# Patient Record
Sex: Male | Born: 1975 | Hispanic: Yes | Marital: Single | Smoking: Never smoker
Health system: Southern US, Community
[De-identification: ages and names within clinical notes are randomized; demographics above are authoritative.]

---

## 2022-01-05 ENCOUNTER — Emergency Department (HOSPITAL_COMMUNITY)
Admission: EM | Admit: 2022-01-05 | Discharge: 2022-01-05 | Disposition: A | Discharge status: Home / Self Care | Payer: Self-pay | Attending: Emergency Medicine | Admitting: Emergency Medicine

## 2022-01-05 ENCOUNTER — Other Ambulatory Visit: Payer: Self-pay

## 2022-01-05 ENCOUNTER — Emergency Department (HOSPITAL_COMMUNITY): Payer: Self-pay

## 2022-01-05 ENCOUNTER — Encounter (HOSPITAL_COMMUNITY): Payer: Self-pay | Admitting: *Deleted

## 2022-01-05 DIAGNOSIS — R112 Nausea with vomiting, unspecified: Secondary | ICD-10-CM | POA: Insufficient documentation

## 2022-01-05 DIAGNOSIS — E871 Hypo-osmolality and hyponatremia: Secondary | ICD-10-CM | POA: Insufficient documentation

## 2022-01-05 DIAGNOSIS — R109 Unspecified abdominal pain: Secondary | ICD-10-CM | POA: Insufficient documentation

## 2022-01-05 DIAGNOSIS — R140 Abdominal distension (gaseous): Secondary | ICD-10-CM | POA: Insufficient documentation

## 2022-01-05 LAB — COMPREHENSIVE METABOLIC PANEL
ALT: 25 U/L (ref 0–44)
AST: 21 U/L (ref 15–41)
Albumin: 4.5 g/dL (ref 3.5–5.0)
Alkaline Phosphatase: 83 U/L (ref 38–126)
Anion gap: 5 (ref 5–15)
BUN: 11 mg/dL (ref 6–20)
CO2: 23 mmol/L (ref 22–32)
Calcium: 9 mg/dL (ref 8.9–10.3)
Chloride: 105 mmol/L (ref 98–111)
Creatinine, Ser: 0.84 mg/dL (ref 0.61–1.24)
GFR, Estimated: >60 mL/min (ref >60)
Glucose, Bld: 96 mg/dL (ref 70–99)
Potassium: 4.1 mmol/L (ref 3.5–5.1)
Sodium: 133 mmol/L — ABNORMAL LOW (ref 135–145)
Total Bilirubin: 0.7 mg/dL (ref 0.3–1.2)
Total Protein: 7.5 g/dL (ref 6.5–8.1)

## 2022-01-05 LAB — CBC WITH DIFFERENTIAL/PLATELET
Abs Immature Granulocytes: 0.03 10*3/uL (ref 0.00–0.07)
Basophils Absolute: 0.1 10*3/uL (ref 0.0–0.1)
Basophils Relative: 1 %
Eosinophils Absolute: 0.2 10*3/uL (ref 0.0–0.5)
Eosinophils Relative: 3 %
HCT: 50.7 % (ref 39.0–52.0)
Hemoglobin: 17.5 g/dL — ABNORMAL HIGH (ref 13.0–17.0)
Immature Granulocytes: 0 %
Lymphocytes Relative: 39 %
Lymphs Abs: 2.7 10*3/uL (ref 0.7–4.0)
MCH: 32.1 pg (ref 26.0–34.0)
MCHC: 34.5 g/dL (ref 30.0–36.0)
MCV: 93 fL (ref 80.0–100.0)
Monocytes Absolute: 0.6 10*3/uL (ref 0.1–1.0)
Monocytes Relative: 8 %
Neutro Abs: 3.4 10*3/uL (ref 1.7–7.7)
Neutrophils Relative %: 49 %
Platelets: 330 10*3/uL (ref 150–400)
RBC: 5.45 MIL/uL (ref 4.22–5.81)
RDW: 12.5 % (ref 11.5–15.5)
WBC: 7.1 10*3/uL (ref 4.0–10.5)
nRBC: 0 % (ref 0.0–0.2)

## 2022-01-05 LAB — LIPASE, BLOOD: Lipase: 30 U/L (ref 11–51)

## 2022-01-05 MED ORDER — ONDANSETRON 4 MG PO TBDP: 4.0000 mg | ORAL_TABLET | Freq: Three times a day (TID) | ORAL | 0 refills | Status: AC | PRN

## 2022-01-05 MED ORDER — PANTOPRAZOLE SODIUM 20 MG PO TBEC: 20.0000 mg | DELAYED_RELEASE_TABLET | Freq: Every day | ORAL | 0 refills | Status: AC

## 2022-01-05 MED ORDER — IOHEXOL 300 MG/ML  SOLN
100.0000 mL | Freq: Once | INTRAMUSCULAR | Status: AC | PRN
  Administered 2022-01-05: 19:00:00 100 mL via INTRAVENOUS

## 2022-01-05 MED ORDER — DICYCLOMINE HCL 20 MG PO TABS: 20.0000 mg | ORAL_TABLET | Freq: Two times a day (BID) | ORAL | 0 refills | Status: AC

## 2022-01-05 NOTE — ED Triage Notes (Signed)
Interpretor used. Patient reports heavy alcohol use on 12/31 and having bloating and nausea since.

## 2022-01-05 NOTE — ED Provider Triage Note (Signed)
Emergency Medicine Provider Triage Evaluation Note  Ralph Olson , a 46 y.o. male  was evaluated in triage.  Pt complains of epigastric abdominal pain after drinking excessive amount of alcohol on New Year's.  Patient states that the abdominal pain is located epigastrically and he is complaining of bloating, nausea, diarrhea.  Patient is unable to tell me how much alcohol he drank on New Year's, just states that afterwards he was nauseous and vomiting and felt bloated.  Patient states that his doctors in [place] advised him in the past to cut down on his drinking, however he states that he probably overdid it this time.  Review of Systems  Positive: Bloating, nausea, diarrhea Negative: Blood in stool, blood in vomit, CP, SOB  Physical Exam  BP 140/88 (BP Location: Right Arm)    Pulse 85    Temp 98.8 F (37.1 C) (Oral)    Resp 18    SpO2 100%  Gen:   Awake, no distress   Resp:  Normal effort  MSK:   Moves extremities without difficulty  Other:  Epigastric abdominal pain, LUQ abdominal pain  Medical Decision Making  Medically screening exam initiated at 2:00 PM.  Appropriate orders placed.  Ralph Olson was informed that the remainder of the evaluation will be completed by another provider, this initial triage assessment does not replace that evaluation, and the importance of remaining in the ED until their evaluation is complete.     Al Decant, PA-C 01/05/22 1401

## 2022-01-05 NOTE — Discharge Instructions (Addendum)
Your work-up today including blood work and CT scan of your abdomen did not show any concerning cause of your abdominal pain.  He also mentioned that your abdominal pain currently has resolved.  I have provided you with Protonix for your previously mentioned gastritis.  Bentyl to take as needed for abdominal pain.  And Zofran to take for nausea.  If you have worsening symptoms please return to the emergency room.  I have also provided you with resources to set up a primary care provider.

## 2022-01-05 NOTE — ED Provider Notes (Signed)
[facility] EMERGENCY DEPARTMENT Provider Note   CSN: [id number] Arrival date & time: 01/05/22  1115     History  Chief Complaint  Patient presents with   Abdominal Pain   Nausea    Ralph Olson is a 46 y.o. male.  46 year old male presents today for evaluation of abdominal pain, abdominal distention of 3-1/2-week duration.  Patient reports he has history of alcohol abuse and was told to cut back on his drinking however on New Year's Eve, as well on 1/2, 1/3 he reports binge drinking and since then he has complained of the symptoms.  He reports since then he is only drank once on 1/6 because he was feeling withdrawal symptoms.  Reports he underwent significant testing in [place] with right upper quadrant ultrasound and blood work which was all reassuring prior to moving to [place] 4 months ago.  He does not have a primary care provider in [place].  He denies fever, chills, nausea, vomiting, changes to his stool habits, constipation.  He reports currently he is symptom-free.  He denies difficulty with p.o. intake.  Denies blood in stool.  He denies flank pain, hematuria.  The history is provided by the patient. No language interpreter was used.      Home Medications Prior to Admission medications   Not on File      Allergies    Patient has no known allergies.    Review of Systems   Review of Systems  Constitutional:  Negative for activity change, chills and fever.  Respiratory:  Negative for shortness of breath.   Cardiovascular:  Negative for chest pain.  Gastrointestinal:  Negative for abdominal distention, abdominal pain, nausea and vomiting.  Genitourinary:  Negative for difficulty urinating and flank pain.  Musculoskeletal:  Negative for back pain.  All other systems reviewed and are negative.  Physical Exam Updated Vital Signs BP 112/77 (BP Location: Right Arm)    Pulse 66    Temp 97.6 F (36.4 C) (Oral)    Resp (!) 22     SpO2 97%  Physical Exam Vitals and nursing note reviewed.  Constitutional:      General: He is not in acute distress.    Appearance: Normal appearance. He is not ill-appearing.  HENT:     Head: Normocephalic and atraumatic.     Nose: Nose normal.  Eyes:     General: No scleral icterus.    Extraocular Movements: Extraocular movements intact.     Conjunctiva/sclera: Conjunctivae normal.  Cardiovascular:     Rate and Rhythm: Normal rate and regular rhythm.     Pulses: Normal pulses.     Heart sounds: Normal heart sounds.  Pulmonary:     Effort: Pulmonary effort is normal. No respiratory distress.     Breath sounds: Normal breath sounds. No wheezing or rales.  Abdominal:     General: There is no distension.     Palpations: Abdomen is soft.     Tenderness: There is no abdominal tenderness. There is no right CVA tenderness, left CVA tenderness, guarding or rebound.  Musculoskeletal:        General: Normal range of motion.     Cervical back: Normal range of motion.  Skin:    General: Skin is warm and dry.  Neurological:     General: No focal deficit present.     Mental Status: He is alert. Mental status is at baseline.    ED Results / Procedures / Treatments  Labs (all labs ordered are listed, but only abnormal results are displayed) Labs Reviewed  CBC WITH DIFFERENTIAL/PLATELET - Abnormal; Notable for the following components:      Result Value   Hemoglobin 17.5 (*)    All other components within normal limits  COMPREHENSIVE METABOLIC PANEL - Abnormal; Notable for the following components:   Sodium 133 (*)    All other components within normal limits  LIPASE, BLOOD  URINALYSIS, ROUTINE W REFLEX MICROSCOPIC    EKG None  Radiology CT Abdomen Pelvis W Contrast  Result Date: 01/05/2022 CLINICAL DATA:  Abdominal pain EXAM: CT ABDOMEN AND PELVIS WITH CONTRAST TECHNIQUE: Multidetector CT imaging of the abdomen and pelvis was performed using the standard protocol following  bolus administration of intravenous contrast. RADIATION DOSE REDUCTION: This exam was performed according to the departmental dose-optimization program which includes automated exposure control, adjustment of the mA and/or kV according to patient size and/or use of iterative reconstruction technique. CONTRAST:  150m OMNIPAQUE IOHEXOL 300 MG/ML  SOLN COMPARISON:  None. FINDINGS: Lower chest: Unremarkable. Hepatobiliary: Liver measures 18.4 cm in length. There is fatty infiltration. There is possible tiny 3 mm low-density in the left lobe which could not be characterized. There is no dilation of bile ducts. Gallbladder is unremarkable. Pancreas: No focal abnormality is seen. Spleen: Spleen is unremarkable. Adrenals/Urinary Tract: Adrenals are not enlarged. There is no hydronephrosis. There is high attenuation in the lumen of pelvocaliceal systems in both kidneys and proximal ureters. This may be related to timing of contrast administration with excreted contrast in the collecting system. This limits evaluation for small nonobstructing stones. Urinary bladder is unremarkable. Stomach/Bowel: Stomach is not distended. Small bowel loops are not dilated. Appendix is not dilated. There is no significant wall thickening in the colon. There are scattered diverticula without signs of focal diverticulitis. Vascular/Lymphatic: Unremarkable. Reproductive: Unremarkable. Other: Possible small bilateral inguinal hernias containing fat are noted. Musculoskeletal: There are small sclerotic densities in the proximal right femur, possibly benign bone islands. Degenerative changes are noted in the lower thoracic spine and lumbar spine. IMPRESSION: There is no evidence of intestinal obstruction or pneumoperitoneum. There is no hydronephrosis. Appendix is not dilated. There is excreted contrast in the collecting systems in both kidneys limiting evaluation for small nonobstructing renal stones. Scattered diverticula are seen in colon  without signs of focal acute diverticulitis. Enlarged fatty liver Other findings as described in the body of the report. Electronically Signed   By: PElmer PickerM.D.   On: 01/05/2022 19:25    Procedures Procedures    Medications Ordered in ED Medications  iohexol (OMNIPAQUE) 300 MG/ML solution 100 mL (100 mLs Intravenous Contrast Given 01/05/22 1900)    ED Course/ Medical Decision Making/ A&P                           Medical Decision Making Risk Prescription drug management.   Medical Decision Making / ED Course   This patient presents to the ED for concern of abdominal pain, nausea, vomiting, this involves an extensive number of treatment options, and is a complaint that carries with it a high risk of complications and morbidity.  The differential diagnosis includes cholecystitis, appendicitis, pancreatitis, other acute intra-abdominal process, GERD, gastritis  MDM: 46year old male presents today for evaluation of abdominal pain, abdominal distention, nausea, vomiting however he has not had the symptoms in the past few days.  He is currently symptom-free.  Work-up today is without leukocytosis, anemia.  CMP with mild hyponatremia 133 otherwise unremarkable and without transaminitis, alk phos elevation, or hyperbilirubinemia.  Lipase within normal limits.  CT abdomen pelvis with contrast without concern for pancreatitis or other acute intra-abdominal process.  Abdomen benign on exam.  Patient does report a remote history of gastritis currently not taking any medication for this.  Reports he does have reflux when he drinks alcohol.  Patient is appropriate for discharge.  Resources provided for patient establish primary care provider.  Return precautions discussed with patient at length.  We will provide patient with Bentyl, Zofran, and Protonix given his history of gastritis.   Lab Tests: -I ordered, reviewed, and interpreted labs.   The pertinent results include:   Labs  Reviewed  CBC WITH DIFFERENTIAL/PLATELET - Abnormal; Notable for the following components:      Result Value   Hemoglobin 17.5 (*)    All other components within normal limits  COMPREHENSIVE METABOLIC PANEL - Abnormal; Notable for the following components:   Sodium 133 (*)    All other components within normal limits  LIPASE, BLOOD  URINALYSIS, ROUTINE W REFLEX MICROSCOPIC      EKG  EKG Interpretation  Date/Time:    Ventricular Rate:    PR Interval:    QRS Duration:   QT Interval:    QTC Calculation:   R Axis:     Text Interpretation:           Imaging Studies ordered: I ordered imaging studies including CT abdomen pelvis with contrast I independently visualized and interpreted imaging. I agree with the radiologist interpretation   Medicines ordered and prescription drug management: Meds ordered this encounter  Medications   iohexol (OMNIPAQUE) 300 MG/ML solution 100 mL   dicyclomine (BENTYL) 20 MG tablet    Sig: Take 1 tablet (20 mg total) by mouth 2 (two) times daily.    Dispense:  20 tablet    Refill:  0    Order Specific Question:   Supervising Provider    Answer:   MILLER, BRIAN [[id number]]   ondansetron (ZOFRAN-ODT) 4 MG disintegrating tablet    Sig: Take 1 tablet (4 mg total) by mouth every 8 (eight) hours as needed for nausea or vomiting.    Dispense:  20 tablet    Refill:  0    Order Specific Question:   Supervising Provider    Answer:   MILLER, BRIAN [[id number]]   pantoprazole (PROTONIX) 20 MG tablet    Sig: Take 1 tablet (20 mg total) by mouth daily.    Dispense:  30 tablet    Refill:  0    Order Specific Question:   Supervising Provider    Answer:   MILLER, BRIAN [[id number]]    -I have reviewed the patients home medicines and have made adjustments as needed  Social Determinants of Health:  Factors impacting patients care include: Recently moved from [place] to [place] 4 months ago and does not have a primary care provider.  Resources provided to set  this up.   Reevaluation: After the interventions noted above, I reevaluated the patient and found that they have :stayed the same remained asymptomatic during his stay here.  Co morbidities that complicate the patient evaluation History reviewed. No pertinent past medical history.    Dispostion: Patient is appropriate for discharge.  Patient discharged in appropriate condition.   Final Clinical Impression(s) / ED Diagnoses Final diagnoses:  Abdominal pain, unspecified abdominal location    Rx / DC Orders ED Discharge Orders  Ordered    dicyclomine (BENTYL) 20 MG tablet  2 times daily        01/05/22 2141    ondansetron (ZOFRAN-ODT) 4 MG disintegrating tablet  Every 8 hours PRN        01/05/22 2141    pantoprazole (PROTONIX) 20 MG tablet  Daily        01/05/22 2141              Evlyn Courier, PA-C 01/05/22 2338    Wyvonnia Dusky, MD 01/06/22 [phone]

## 2023-02-05 IMAGING — CT CT ABD-PELV W/ CM
2 of 5 series · 16 of 46 positions shown, 18 images · IV contrast (APPLIED)
Comparison: None.

CLINICAL DATA: Abdominal pain

EXAM:
CT ABDOMEN AND PELVIS WITH CONTRAST
TECHNIQUE: Multidetector CT imaging of the abdomen and pelvis was performed
using the standard protocol following bolus administration of
intravenous contrast.

[Series 3: abdomen 5.0 · axial · 0.87mm/px · z∈[-399,-9]mm · 13 of 92 slices shown, 15 images]
[im 7/92  soft-tissue]
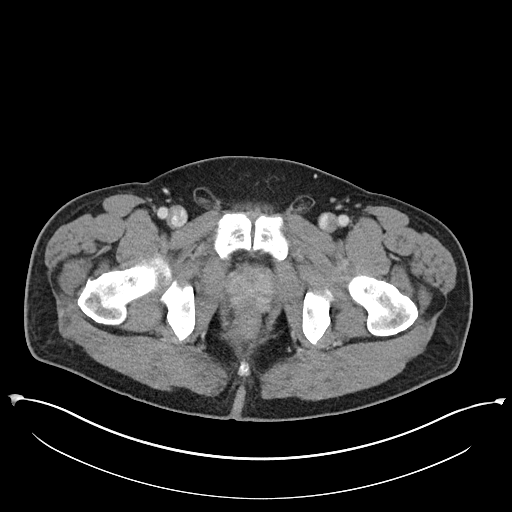
[im 7/92  bone]
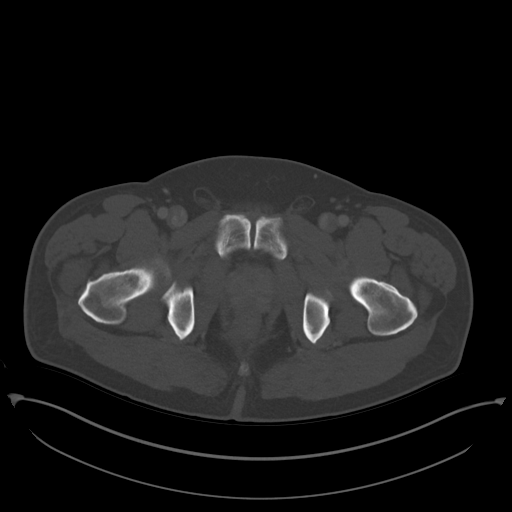
[im 13/92  soft-tissue]
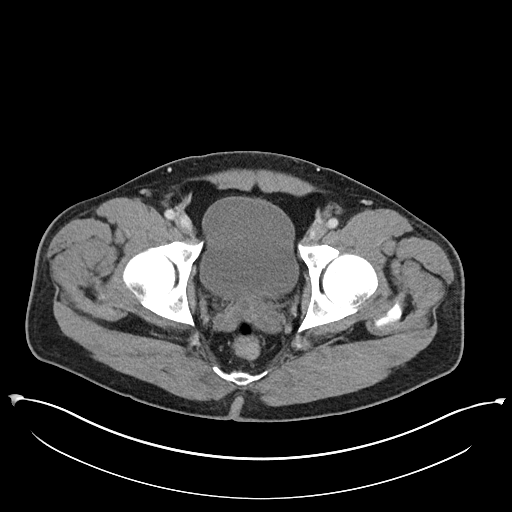
[im 19/92  soft-tissue]
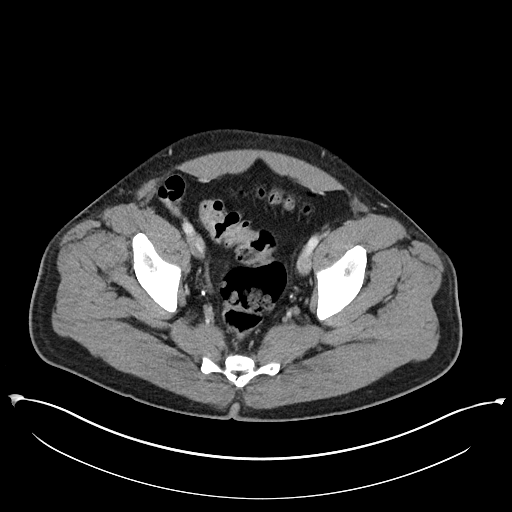
[im 25/92  soft-tissue]
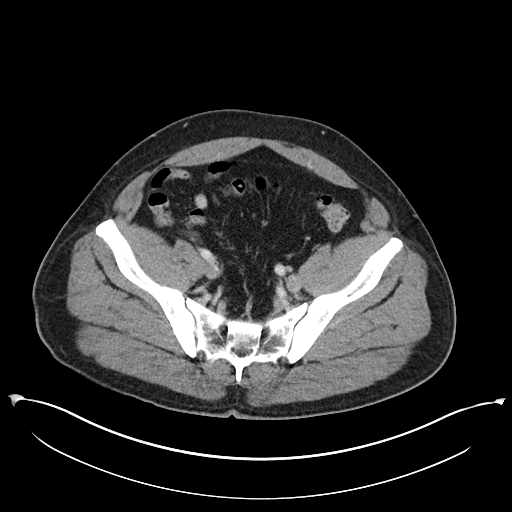
[im 31/92  soft-tissue]
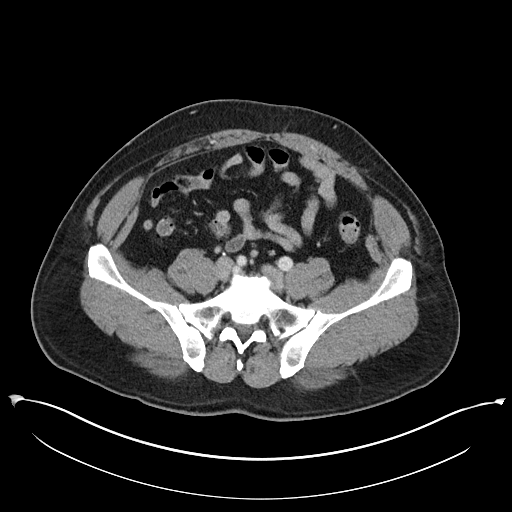
[im 37/92  soft-tissue]
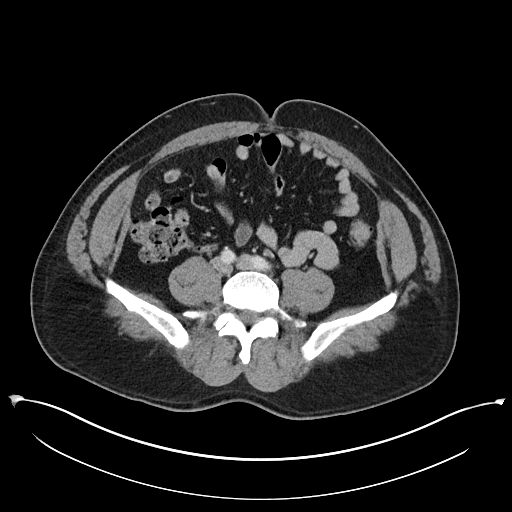
[im 49/92  soft-tissue]
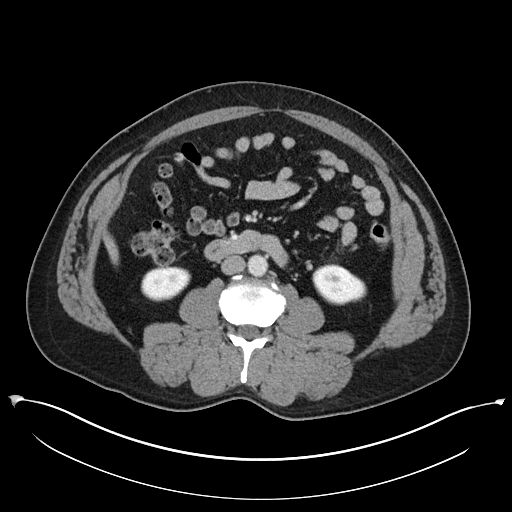
[im 55/92  soft-tissue]
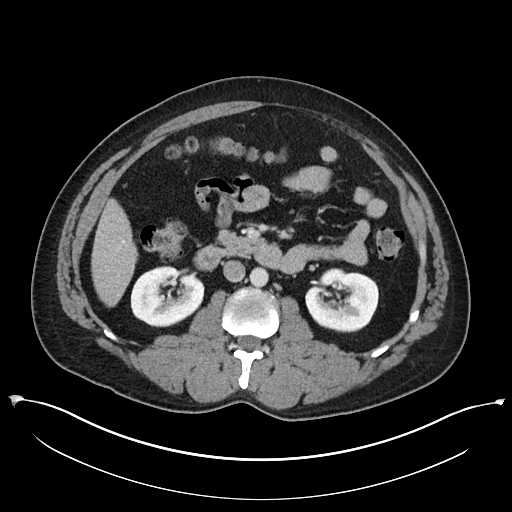
[im 61/92  soft-tissue]
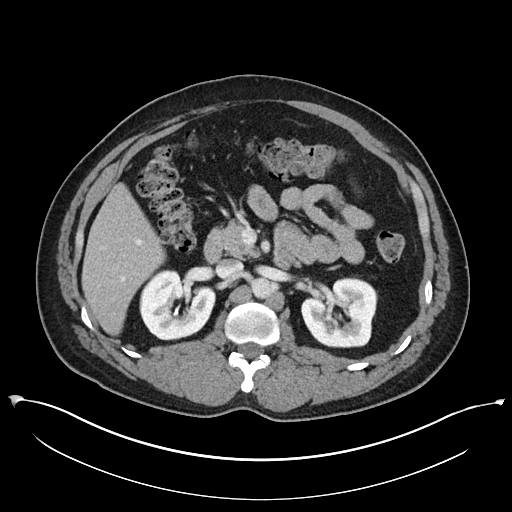
[im 61/92  bone]
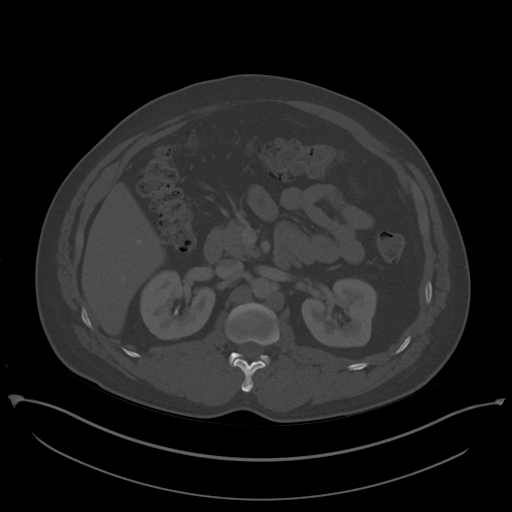
[im 67/92  soft-tissue]
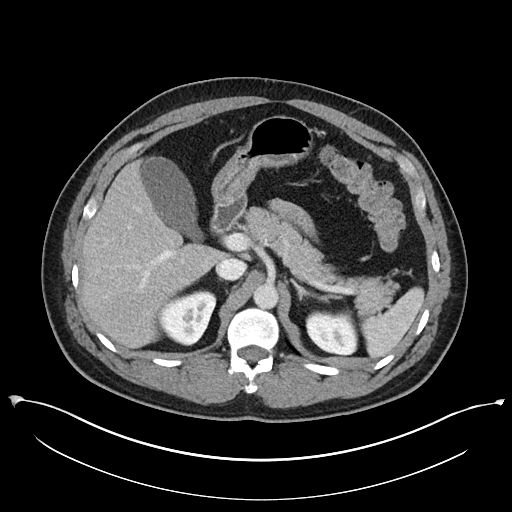
[im 73/92  soft-tissue]
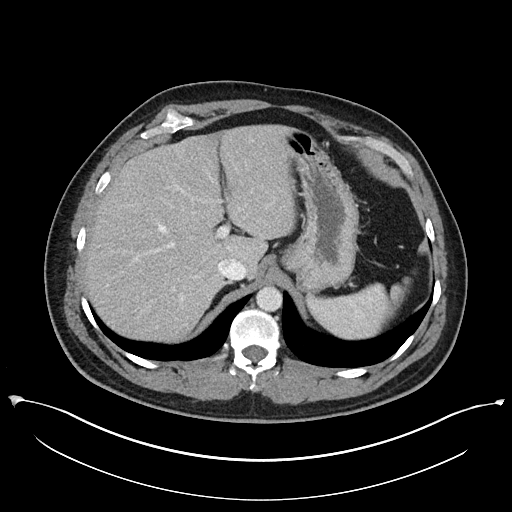
[im 79/92  soft-tissue]
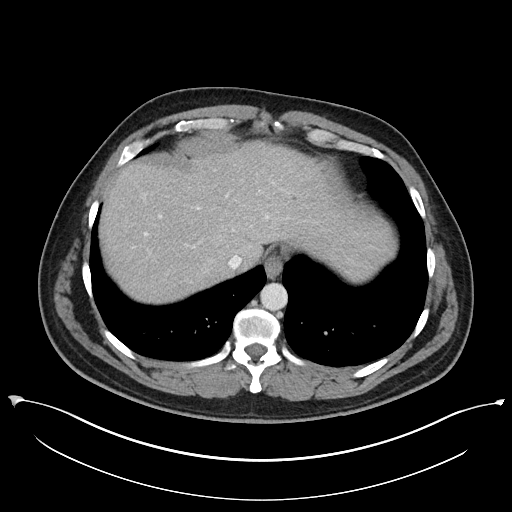
[im 85/92  soft-tissue]
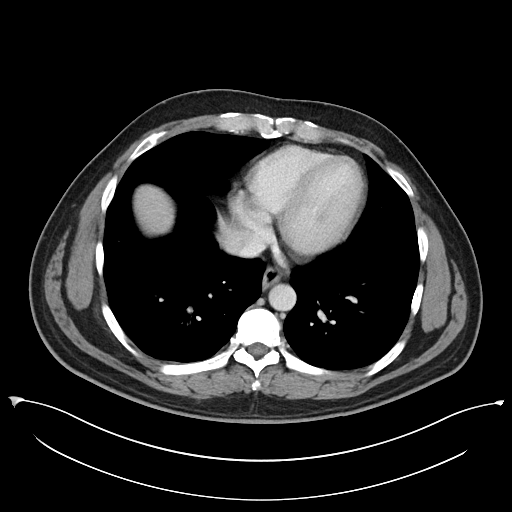

[Series 6: abdomen 3.0 mpr cor · coronal · 0.81mm/px · 3 of 103 slices shown]
[im 35/103  soft-tissue]
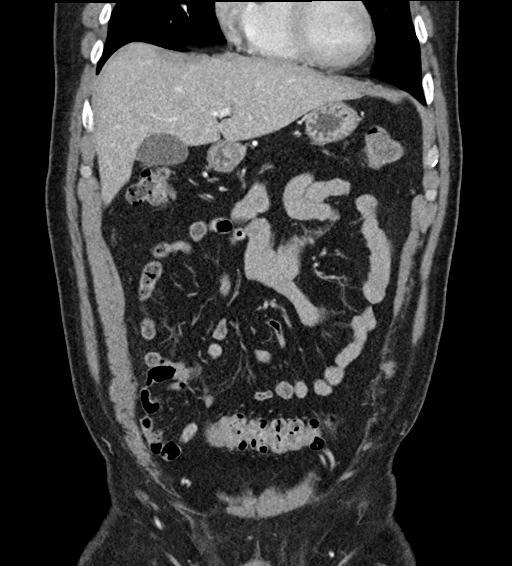
[im 46/103  soft-tissue]
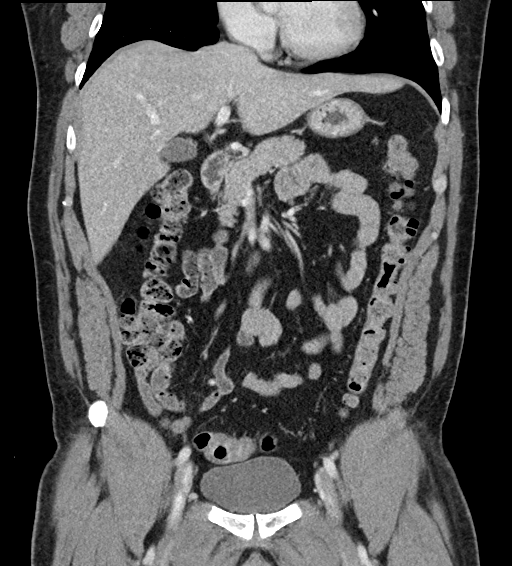
[im 57/103  soft-tissue]
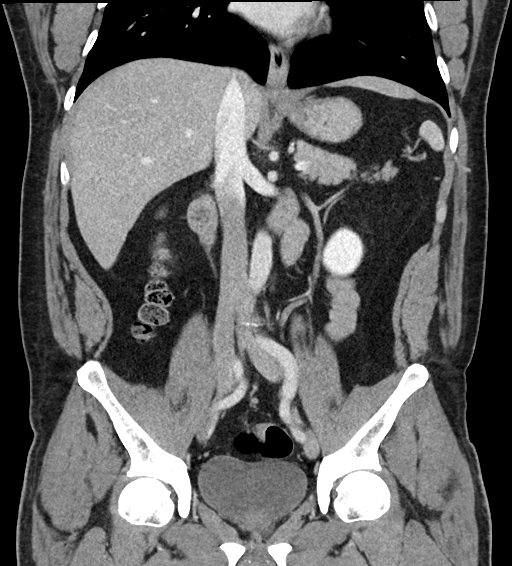

[16 of 46 positions shown; findings below may reference images not displayed]

RADIATION DOSE REDUCTION: This exam was performed according to the
departmental dose-optimization program which includes automated
exposure control, adjustment of the mA and/or kV according to
patient size and/or use of iterative reconstruction technique.

CONTRAST:  100mL OMNIPAQUE IOHEXOL 300 MG/ML  SOLN
FINDINGS: Lower chest: Unremarkable.

Hepatobiliary: Liver measures 18.4 cm in length. There is fatty
infiltration. There is possible tiny 3 mm low-density in the left
lobe which could not be characterized. There is no dilation of bile
ducts. Gallbladder is unremarkable.

Pancreas: No focal abnormality is seen.

Spleen: Spleen is unremarkable.

Adrenals/Urinary Tract: Adrenals are not enlarged. There is no
hydronephrosis. There is high attenuation in the lumen of
pelvocaliceal systems in both kidneys and proximal ureters. This may
be related to timing of contrast administration with excreted
contrast in the collecting system. This limits evaluation for small
nonobstructing stones. Urinary bladder is unremarkable.

Stomach/Bowel: Stomach is not distended. Small bowel loops are not
dilated. Appendix is not dilated. There is no significant wall
thickening in the colon. There are scattered diverticula without
signs of focal diverticulitis.

Vascular/Lymphatic: Unremarkable.

Reproductive: Unremarkable.

Other: Possible small bilateral inguinal hernias containing fat are
noted.

Musculoskeletal: There are small sclerotic densities in the proximal
right femur, possibly benign bone islands. Degenerative changes are
noted in the lower thoracic spine and lumbar spine.
IMPRESSION: There is no evidence of intestinal obstruction or pneumoperitoneum.
There is no hydronephrosis. Appendix is not dilated.

There is excreted contrast in the collecting systems in both kidneys
limiting evaluation for small nonobstructing renal stones.

Scattered diverticula are seen in colon without signs of focal acute
diverticulitis. Enlarged fatty liver

Other findings as described in the body of the report.

## 2024-12-20 ENCOUNTER — Other Ambulatory Visit: Payer: Self-pay

## 2024-12-20 ENCOUNTER — Emergency Department (HOSPITAL_COMMUNITY)
Admission: EM | Admit: 2024-12-20 | Discharge: 2024-12-20 | Disposition: A | Discharge status: Home / Self Care | Payer: Self-pay | Attending: Emergency Medicine | Admitting: Emergency Medicine

## 2024-12-20 ENCOUNTER — Encounter (HOSPITAL_COMMUNITY): Payer: Self-pay

## 2024-12-20 DIAGNOSIS — N342 Other urethritis: Secondary | ICD-10-CM | POA: Insufficient documentation

## 2024-12-20 DIAGNOSIS — Z202 Contact with and (suspected) exposure to infections with a predominantly sexual mode of transmission: Secondary | ICD-10-CM | POA: Insufficient documentation

## 2024-12-20 DIAGNOSIS — B356 Tinea cruris: Secondary | ICD-10-CM | POA: Insufficient documentation

## 2024-12-20 LAB — URINALYSIS, ROUTINE W REFLEX MICROSCOPIC
Bilirubin Urine: NEGATIVE
Glucose, UA: NEGATIVE mg/dL
Hgb urine dipstick: NEGATIVE
Ketones, ur: NEGATIVE mg/dL
Leukocytes,Ua: NEGATIVE
Nitrite: NEGATIVE
Protein, ur: NEGATIVE mg/dL
Specific Gravity, Urine: 1.02 (ref 1.005–1.030)
pH: 7 (ref 5.0–8.0)

## 2024-12-20 LAB — RAPID HIV SCREEN (HIV 1/2 AB+AG)
HIV 1/2 Antibodies: NONREACTIVE
HIV-1 P24 Antigen - HIV24: NONREACTIVE

## 2024-12-20 LAB — CBG MONITORING, ED: Glucose-Capillary: 107 mg/dL — ABNORMAL HIGH (ref 70–99)

## 2024-12-20 MED ORDER — CLOTRIMAZOLE 1 % EX CREA: TOPICAL_CREAM | CUTANEOUS | 0 refills | Status: AC

## 2024-12-20 MED ORDER — DOXYCYCLINE HYCLATE 100 MG PO CAPS: 100.0000 mg | ORAL_CAPSULE | Freq: Two times a day (BID) | ORAL | 0 refills | Status: AC

## 2024-12-20 MED ORDER — CEFTRIAXONE SODIUM 500 MG IJ SOLR
500.0000 mg | Freq: Once | INTRAMUSCULAR | Status: AC
  Administered 2024-12-20: 500 mg via INTRAMUSCULAR
  Filled 2024-12-20: qty 500

## 2024-12-20 MED ORDER — LIDOCAINE HCL (PF) 1 % IJ SOLN
1.0000 mL | Freq: Once | INTRAMUSCULAR | Status: AC
  Administered 2024-12-20: 1 mL
  Filled 2024-12-20: qty 5

## 2024-12-20 MED ORDER — DOXYCYCLINE HYCLATE 100 MG PO TABS
100.0000 mg | ORAL_TABLET | Freq: Once | ORAL | Status: AC
  Administered 2024-12-20: 100 mg via ORAL
  Filled 2024-12-20: qty 1

## 2024-12-20 NOTE — ED Triage Notes (Addendum)
 Spanish Interpreter used: Pt reports he has been having penile pain, itching and a rash on his testicles. States he now has a sore where he has been itching. Reports dysuria. Denies discharge. Pt AxOx4.

## 2024-12-20 NOTE — ED Provider Notes (Signed)
 " [place] EMERGENCY DEPARTMENT AT [facility] Provider Note   CSN: [id number] Arrival date & time: 12/20/24  1044     Patient presents with: Penis Pain, Rash, and Dysuria   Ralph Olson is a 49 y.o. male.   Pt is a 49 yo male with no significant pmhx.  Pt has been having some dysuria and a rash to his testicles.  He has been itching and has now has a sore.  He is sexually active with his wife, but also said he had oral sex with another person.  He denies any penile d/c.  Due to language barrier, an interpreter was present during the history-taking and subsequent discussion (and for part of the physical exam) with this patient.        Prior to Admission medications  Medication Sig Start Date End Date Taking? Authorizing Provider  clotrimazole  (LOTRIMIN ) 1 % cream Apply to affected area 2 times daily 12/20/24  Yes Daphne Karrer, MD  doxycycline  (VIBRAMYCIN ) 100 MG capsule Take 1 capsule (100 mg total) by mouth 2 (two) times daily. 12/20/24  Yes Dean Clarity, MD  dicyclomine  (BENTYL ) 20 MG tablet Take 1 tablet (20 mg total) by mouth 2 (two) times daily. 01/05/22   Hildegard Loge, PA-C  ondansetron  (ZOFRAN -ODT) 4 MG disintegrating tablet Take 1 tablet (4 mg total) by mouth every 8 (eight) hours as needed for nausea or vomiting. 01/05/22   Hildegard, Amjad, PA-C  pantoprazole  (PROTONIX ) 20 MG tablet Take 1 tablet (20 mg total) by mouth daily. 01/05/22 02/04/22  Hildegard Loge, PA-C    Allergies: Patient has no known allergies.    Review of Systems  Genitourinary:  Positive for dysuria.  All other systems reviewed and are negative.   Updated Vital Signs BP 121/78   Pulse 85   Temp 98.2 F (36.8 C)   Resp 16   SpO2 97%   Physical Exam Vitals and nursing note reviewed. Exam conducted with a chaperone present.  Constitutional:      Appearance: Normal appearance.  HENT:     Head: Normocephalic and atraumatic.     Right Ear: External ear normal.     Left Ear: External ear  normal.     Nose: Nose normal.     Mouth/Throat:     Mouth: Mucous membranes are moist.     Pharynx: Oropharynx is clear.  Eyes:     Extraocular Movements: Extraocular movements intact.     Conjunctiva/sclera: Conjunctivae normal.     Pupils: Pupils are equal, round, and reactive to light.  Cardiovascular:     Rate and Rhythm: Normal rate and regular rhythm.     Pulses: Normal pulses.     Heart sounds: Normal heart sounds.  Pulmonary:     Effort: Pulmonary effort is normal.     Breath sounds: Normal breath sounds.  Abdominal:     General: Abdomen is flat. Bowel sounds are normal.     Palpations: Abdomen is soft.  Genitourinary:    Comments: Rash around gu area (likely tinea cruris).  No ulcerations.  No vesicles. Musculoskeletal:        General: Normal range of motion.     Cervical back: Normal range of motion and neck supple.  Skin:    General: Skin is warm.     Capillary Refill: Capillary refill takes less than 2 seconds.  Neurological:     General: No focal deficit present.     Mental Status: He is alert and oriented to  person, place, and time.  Psychiatric:        Mood and Affect: Mood normal.        Behavior: Behavior normal.     (all labs ordered are listed, but only abnormal results are displayed) Labs Reviewed  CBG MONITORING, ED - Abnormal; Notable for the following components:      Result Value   Glucose-Capillary 107 (*)    All other components within normal limits  URINALYSIS, ROUTINE W REFLEX MICROSCOPIC  SYPHILIS: RPR W/REFLEX TO RPR TITER AND TREPONEMAL ANTIBODIES, TRADITIONAL SCREENING AND DIAGNOSIS ALGORITHM  RAPID HIV SCREEN (HIV 1/2 AB+AG)  GC/CHLAMYDIA PROBE AMP ([place]) NOT AT [facility]    EKG: None  Radiology: No results found.   Procedures   Medications Ordered in the ED  cefTRIAXone  (ROCEPHIN ) injection 500 mg (has no administration in time range)  lidocaine  (PF) (XYLOCAINE ) 1 % injection 1-2.1 mL (has no administration in time range)   doxycycline  (VIBRA -TABS) tablet 100 mg (has no administration in time range)                                    Medical Decision Making Amount and/or Complexity of Data Reviewed Labs: ordered.  Risk Prescription drug management.   This patient presents to the ED for concern of dysuria, this involves an extensive number of treatment options, and is a complaint that carries with it a high risk of complications and morbidity.  The differential diagnosis includes uti, urethritis, syphilis, hiv   Co morbidities that complicate the patient evaluation  none   Additional history obtained:  Additional history obtained from epic chart review    Lab Tests:  I Ordered, and personally interpreted labs.  The pertinent results include:  ua neg; cbg nl   Medicines ordered and prescription drug management:  I ordered medication including rocephin , doxy  for possible sti  Reevaluation of the patient after these medicines showed that the patient stayed the same I have reviewed the patients home medicines and have made adjustments as needed   Problem List / ED Course:  Dysuria:  neg ua.  Pt will be treated for sti with rocephin /doxy.  Rpr, hiv, gc/chl sent off. Rash:  likely tinea cruris.  He is d/c with lamisil.   Reevaluation:  After the interventions noted above, I reevaluated the patient and found that they have :improved   Social Determinants of Health:  Spanish speaker; no insurance; no pcp   Dispostion:  After consideration of the diagnostic results and the patients response to treatment, I feel that the patent would benefit from discharge with outpatient f/u.       Final diagnoses:  Urethritis  Possible exposure to STD  Tinea cruris    ED Discharge Orders          Ordered    doxycycline  (VIBRAMYCIN ) 100 MG capsule  2 times daily        12/20/24 1542    clotrimazole  (LOTRIMIN ) 1 % cream        12/20/24 1546               Dean Clarity,  MD 12/20/24 1608  "

## 2024-12-21 LAB — SYPHILIS: RPR W/REFLEX TO RPR TITER AND TREPONEMAL ANTIBODIES, TRADITIONAL SCREENING AND DIAGNOSIS ALGORITHM: RPR Ser Ql: NONREACTIVE

## 2024-12-23 LAB — GC/CHLAMYDIA PROBE AMP (~~LOC~~) NOT AT ARMC
Chlamydia: NEGATIVE
Comment: NEGATIVE
Comment: NORMAL
Neisseria Gonorrhea: NEGATIVE
